# Patient Record
Sex: Male | Born: 1985
Health system: Southern US, Community
[De-identification: ages and names within clinical notes are randomized; demographics above are authoritative.]

## PROBLEM LIST (undated history)

## (undated) HISTORY — PX: WISDOM TOOTH EXTRACTION: SHX21

---

## 2015-04-17 ENCOUNTER — Encounter (HOSPITAL_BASED_OUTPATIENT_CLINIC_OR_DEPARTMENT_OTHER): Payer: Self-pay | Admitting: *Deleted

## 2015-04-17 ENCOUNTER — Emergency Department (HOSPITAL_BASED_OUTPATIENT_CLINIC_OR_DEPARTMENT_OTHER)
Admission: EM | Admit: 2015-04-17 | Discharge: 2015-04-17 | Disposition: A | Discharge status: Home / Self Care | Payer: Self-pay | Attending: Emergency Medicine | Admitting: Emergency Medicine

## 2015-04-17 DIAGNOSIS — K029 Dental caries, unspecified: Secondary | ICD-10-CM | POA: Insufficient documentation

## 2015-04-17 DIAGNOSIS — K088 Other specified disorders of teeth and supporting structures: Secondary | ICD-10-CM | POA: Insufficient documentation

## 2015-04-17 DIAGNOSIS — K002 Abnormalities of size and form of teeth: Secondary | ICD-10-CM | POA: Insufficient documentation

## 2015-04-17 DIAGNOSIS — K0889 Other specified disorders of teeth and supporting structures: Secondary | ICD-10-CM

## 2015-04-17 MED ORDER — PENICILLIN V POTASSIUM 250 MG PO TABS
500.0000 mg | ORAL_TABLET | Freq: Once | ORAL | Status: AC
  Administered 2015-04-17: 500 mg via ORAL
  Filled 2015-04-17: qty 2

## 2015-04-17 MED ORDER — PENICILLIN V POTASSIUM 500 MG PO TABS: 500.0000 mg | ORAL_TABLET | Freq: Four times a day (QID) | ORAL | Status: AC

## 2015-04-17 MED ORDER — OXYCODONE-ACETAMINOPHEN 5-325 MG PO TABS
1.0000 | ORAL_TABLET | Freq: Once | ORAL | Status: AC
  Administered 2015-04-17: 1 via ORAL
  Filled 2015-04-17: qty 1

## 2015-04-17 NOTE — Discharge Instructions (Signed)

## 2015-04-17 NOTE — ED Provider Notes (Signed)
CSN: [id number]     Arrival date & time 04/17/15  1933 History   First MD Initiated Contact with Patient 04/17/15 1943     Chief Complaint  Patient presents with  . Dental Pain     Patient is a 29 y.o. male presenting with tooth pain. The history is provided by the patient.  Dental Pain Location:  Lower Severity:  Moderate Onset quality:  Gradual Duration:  2 days Timing:  Constant Progression:  Worsening Chronicity:  New Relieved by:  Nothing Associated symptoms: no fever      PMH - none  History  Substance Use Topics  . Smoking status: Never Smoker   . Smokeless tobacco: Not on file  . Alcohol Use: No    Review of Systems  Constitutional: Negative for fever.  Gastrointestinal: Negative for vomiting.      Allergies  Review of patient's allergies indicates no known allergies.  Home Medications   Prior to Admission medications   Medication Sig Start Date End Date Taking? Authorizing Provider  penicillin v potassium (VEETID) 500 MG tablet Take 1 tablet (500 mg total) by mouth 4 (four) times daily. 04/17/15 04/24/15  Zadie Rhineonald Rhonna Holster, MD   BP 154/95 mmHg  Pulse 80  Temp(Src) 98.6 F (37 C)  Resp 18  Ht 6\' 2"  (1.88 m)  Wt 155 lb (70.308 kg)  BMI 19.89 kg/m2  SpO2 100% Physical Exam CONSTITUTIONAL: Well developed/well nourished HEAD AND FACE: Normocephalic/atraumatic EYES: EOMI/PERRL ENMT: Mucous membranes moist.  Poor dentition.  No trismus.  No focal abscess noted.  Decayed tooth to left lower premolarpromal NECK: supple no meningeal signs CV: S1/S2 noted, no murmurs/rubs/gallops noted LUNGS: Lungs are clear to auscultation bilaterally, no apparent distress ABDOMEN: soft, nontender, no rebound or guarding NEURO: Pt is awake/alert, moves all extremitiesx4 EXTREMITIES:full ROM SKIN: warm, color normal  ED Course  Procedures   He has f/u with dental later this week  MDM   Final diagnoses:  Pain, dental    Nursing notes including past medical  history and social history reviewed and considered in documentation     Zadie Rhineonald Monice Lundy, MD 04/17/15 1953

## 2015-04-17 NOTE — ED Notes (Signed)
Pt c/o dental pain x 2 days.  

## 2015-04-17 NOTE — ED Notes (Signed)
Pt significant other will drive pt home.

## 2016-10-29 ENCOUNTER — Emergency Department (HOSPITAL_BASED_OUTPATIENT_CLINIC_OR_DEPARTMENT_OTHER)
Admission: EM | Admit: 2016-10-29 | Discharge: 2016-10-29 | Disposition: A | Discharge status: Home / Self Care | Payer: Self-pay | Attending: Emergency Medicine | Admitting: Emergency Medicine

## 2016-10-29 ENCOUNTER — Encounter (HOSPITAL_BASED_OUTPATIENT_CLINIC_OR_DEPARTMENT_OTHER): Payer: Self-pay | Admitting: *Deleted

## 2016-10-29 DIAGNOSIS — K029 Dental caries, unspecified: Secondary | ICD-10-CM | POA: Insufficient documentation

## 2016-10-29 DIAGNOSIS — K0889 Other specified disorders of teeth and supporting structures: Secondary | ICD-10-CM

## 2016-10-29 MED ORDER — TRAMADOL HCL 50 MG PO TABS: 50.0000 mg | ORAL_TABLET | Freq: Four times a day (QID) | ORAL | 0 refills | Status: DC | PRN

## 2016-10-29 MED ORDER — TRAMADOL HCL 50 MG PO TABS
50.0000 mg | ORAL_TABLET | Freq: Once | ORAL | Status: AC
  Administered 2016-10-29: 50 mg via ORAL
  Filled 2016-10-29: qty 1

## 2016-10-29 MED ORDER — KETOROLAC TROMETHAMINE 60 MG/2ML IM SOLN
60.0000 mg | Freq: Once | INTRAMUSCULAR | Status: AC
  Administered 2016-10-29: 60 mg via INTRAMUSCULAR
  Filled 2016-10-29: qty 2

## 2016-10-29 NOTE — ED Triage Notes (Signed)
C/o left upper tooth pain x 2-3 days  Worse today

## 2016-10-29 NOTE — ED Provider Notes (Signed)
[facility]   CSN: [id number] Arrival date & time: 10/29/16  0149     History   Chief Complaint Chief Complaint  Patient presents with  . Dental Pain    HPI Benjamin Mullins is a 31 y.o. male with no significant past medical history presenting today with dental pain. He states this has been going on for the past couple of days intermittently. Tonight it got severe. Sick at home without any relief. He denies any swelling or drainage. He does not have a dentist as he cannot afford one. There are no further complaints.  10 Systems reviewed and are negative for acute change except as noted in the HPI.   HPI  History reviewed. No pertinent past medical history.  There are no active problems to display for this patient.   History reviewed. No pertinent surgical history.     Home Medications    Prior to Admission medications   Not on File    Family History No family history on file.  Social History Social History  Substance Use Topics  . Smoking status: Never Smoker  . Smokeless tobacco: Not on file  . Alcohol use No     Allergies   Patient has no known allergies.   Review of Systems Review of Systems   Physical Exam Updated Vital Signs BP 149/85 (BP Location: Right Arm)   Pulse 75   Temp 98.1 F (36.7 C) (Oral)   Resp 16   Ht 6\' 2"  (1.88 m)   Wt 176 lb (79.8 kg)   SpO2 100%   BMI 22.60 kg/m   Physical Exam  Constitutional: He is oriented to person, place, and time. Vital signs are normal. He appears well-developed and well-nourished.  Non-toxic appearance. He does not appear ill. No distress.  HENT:  Head: Normocephalic and atraumatic.  Nose: Nose normal.  Mouth/Throat: Oropharynx is clear and moist. No oropharyngeal exudate.  Overall dentition appears well. Tooth #13 however has a chipped. There is an obvious dental carie present as well.  Eyes: Conjunctivae and EOM are normal. Pupils are equal, round, and reactive to  light. No scleral icterus.  Neck: Normal range of motion. Neck supple. No tracheal deviation, no edema, no erythema and normal range of motion present. No thyroid mass and no thyromegaly present.  Cardiovascular: Normal rate, regular rhythm, S1 normal, S2 normal, normal heart sounds, intact distal pulses and normal pulses.  Exam reveals no gallop and no friction rub.   No murmur heard. Pulmonary/Chest: Effort normal and breath sounds normal. No respiratory distress. He has no wheezes. He has no rhonchi. He has no rales.  Abdominal: Soft. Normal appearance and bowel sounds are normal. He exhibits no distension, no ascites and no mass. There is no hepatosplenomegaly. There is no tenderness. There is no rebound, no guarding and no CVA tenderness.  Musculoskeletal: Normal range of motion. He exhibits no edema or tenderness.  Lymphadenopathy:    He has no cervical adenopathy.  Neurological: He is alert and oriented to person, place, and time. He has normal strength. No cranial nerve deficit or sensory deficit.  Skin: Skin is warm, dry and intact. No petechiae and no rash noted. He is not diaphoretic. No erythema. No pallor.  Nursing note and vitals reviewed.    ED Treatments / Results  Labs (all labs ordered are listed, but only abnormal results are displayed) Labs Reviewed - No data to display  EKG  EKG Interpretation None  Radiology No results found.  Procedures Procedures (including critical care time)  Medications Ordered in ED Medications  ketorolac (TORADOL) injection 60 mg (not administered)  traMADol (ULTRAM) tablet 50 mg (not administered)     Initial Impression / Assessment and Plan / ED Course  I have reviewed the triage vital signs and the nursing notes.  Pertinent labs & imaging results that were available during my care of the patient were reviewed by me and considered in my medical decision making (see chart for details).  Clinical Course     Patient  presents emergency department for dental pain. He was given Toradol and Tylenol for relief. We'll discharge home with 10 tablets of tramadol to take as needed. To follow-up also provided. He demonstrates good understanding of the plan that he see a dentist at this only get worse. He appears well-developed acute distress, vital signs were within his normal limits and he is safe for discharge.  Final Clinical Impressions(s) / ED Diagnoses   Final diagnoses:  Pain, dental    New Prescriptions New Prescriptions   No medications on file     Tomasita Crumble, MD 10/29/16 [phone]

## 2016-10-30 ENCOUNTER — Emergency Department (HOSPITAL_BASED_OUTPATIENT_CLINIC_OR_DEPARTMENT_OTHER)
Admission: EM | Admit: 2016-10-30 | Discharge: 2016-10-30 | Disposition: A | Discharge status: Home / Self Care | Payer: Self-pay | Attending: Emergency Medicine | Admitting: Emergency Medicine

## 2016-10-30 ENCOUNTER — Encounter (HOSPITAL_BASED_OUTPATIENT_CLINIC_OR_DEPARTMENT_OTHER): Payer: Self-pay | Admitting: *Deleted

## 2016-10-30 DIAGNOSIS — K047 Periapical abscess without sinus: Secondary | ICD-10-CM | POA: Insufficient documentation

## 2016-10-30 DIAGNOSIS — Z79899 Other long term (current) drug therapy: Secondary | ICD-10-CM | POA: Insufficient documentation

## 2016-10-30 DIAGNOSIS — K029 Dental caries, unspecified: Secondary | ICD-10-CM | POA: Insufficient documentation

## 2016-10-30 MED ORDER — PENICILLIN V POTASSIUM 500 MG PO TABS: 500.0000 mg | ORAL_TABLET | Freq: Three times a day (TID) | ORAL | 0 refills | Status: DC

## 2016-10-30 MED FILL — PENICILLIN VK 500 MG TABLET: 500 | 7 days supply | Qty: 21 | Fill #0

## 2016-10-30 NOTE — ED Provider Notes (Signed)
[facility]   CSN: [id number] Arrival date & time: 10/30/16  0849     History   Chief Complaint Chief Complaint  Patient presents with  . Dental Pain    HPI Benjamin Mullins is a 31 y.o. male.  Patient presents with 3-4 days of left maxillary facial pain, seen in emergency department on 10/29/16 and prescribed tramadol. Since then, patient has developed swelling in his left cheek. No difficulty with vision, breathing, swallowing. No fevers or vomiting. Patient has been taking over-the-counter NSAIDs without relief. Onset of symptoms acute. Course is gradually worsening. Nothing makes symptoms better or worse.      History reviewed. No pertinent past medical history.  There are no active problems to display for this patient.   Past Surgical History:  Procedure Laterality Date  . WISDOM TOOTH EXTRACTION         Home Medications    Prior to Admission medications   Medication Sig Start Date End Date Taking? Authorizing Provider  traMADol (ULTRAM) 50 MG tablet Take 1 tablet (50 mg total) by mouth every 6 (six) hours as needed. 10/29/16  Yes Tomasita Crumble, MD  penicillin v potassium (VEETID) 500 MG tablet Take 1 tablet (500 mg total) by mouth 3 (three) times daily. 10/30/16   Renne Crigler, PA-C    Family History No family history on file.  Social History Social History  Substance Use Topics  . Smoking status: Never Smoker  . Smokeless tobacco: Never Used  . Alcohol use Yes     Comment: beer occasionally     Allergies   Patient has no known allergies.   Review of Systems Review of Systems  Constitutional: Negative for fever.  HENT: Positive for dental problem and facial swelling. Negative for ear pain, sore throat and trouble swallowing.   Respiratory: Negative for shortness of breath and stridor.   Musculoskeletal: Negative for neck pain.  Skin: Negative for color change.  Neurological: Negative for headaches.     Physical  Exam Updated Vital Signs BP 137/98 (BP Location: Left Arm)   Pulse 94   Temp 98.1 F (36.7 C) (Oral)   Resp 18   Ht 6\' 2"  (1.88 m)   Wt 79.8 kg   SpO2 99%   BMI 22.60 kg/m   Physical Exam  Constitutional: He appears well-developed and well-nourished.  HENT:  Head: Normocephalic and atraumatic.  Right Ear: Tympanic membrane, external ear and ear canal normal.  Left Ear: Tympanic membrane, external ear and ear canal normal.  Nose: Nose normal.  Mouth/Throat: Uvula is midline, oropharynx is clear and moist and mucous membranes are normal. No trismus in the jaw. Abnormal dentition. Dental caries present. No dental abscesses or uvula swelling. No tonsillar abscesses.  Mild tenderness to palpation in the area of tooth #14. Patient has slight firmness of the soft tissues however no palpable abscess at this time. He has mild edema of the left upper cheek. No periorbital cellulitis.  Eyes: Pupils are equal, round, and reactive to light.  Neck: Normal range of motion. Neck supple.  No neck swelling or Lugwig's angina  Neurological: He is alert.  Skin: Skin is warm and dry.  Psychiatric: He has a normal mood and affect.  Nursing note and vitals reviewed.    ED Treatments / Results   Procedures Procedures (including critical care time)  Medications Ordered in ED Medications - No data to display   Initial Impression / Assessment and Plan / ED Course  I have  reviewed the triage vital signs and the nursing notes.  Pertinent labs & imaging results that were available during my care of the patient were reviewed by me and considered in my medical decision making (see chart for details).  Clinical Course    Patient seen and examined.  Rx penicillin.   Vital signs reviewed and are as follows: BP 137/98 (BP Location: Left Arm)   Pulse 94   Temp 98.1 F (36.7 C) (Oral)   Resp 18   Ht 6\' 2"  (1.88 m)   Wt 79.8 kg   SpO2 99%   BMI 22.60 kg/m   Patient counseled to take prescribed  medications as directed, return with worsening facial or neck swelling, and to follow-up with their dentist as soon as possible.   Final Clinical Impressions(s) / ED Diagnoses   Final diagnoses:  Dental infection   Patient with toothache. No fever. Exam unconcerning for Ludwig's angina or other deep tissue infection in neck.   As there is facial swelling, will treat with antibiotic and have patient continue NSAIDs and tramadol. Urged patient to follow-up with dentist.     New Prescriptions New Prescriptions   PENICILLIN V POTASSIUM (VEETID) 500 MG TABLET    Take 1 tablet (500 mg total) by mouth 3 (three) times daily.     Renne CriglerJoshua Caelin Rayl, PA-C 10/30/16 [id number]    Jacalyn LefevreJulie Haviland, MD 10/30/16 1025

## 2016-10-30 NOTE — ED Triage Notes (Signed)
Pain in left upper molar this week. Seen in ED and given tramadol. Now has facial swelling which started yesterday

## 2016-10-30 NOTE — Discharge Instructions (Signed)
Please read and follow all provided instructions.  Your diagnoses today include:  1. Dental infection     The exam and treatment you received today has been provided on an emergency basis only. This is not a substitute for complete medical or dental care.  Tests performed today include:  Vital signs. See below for your results today.   Medications prescribed:   Penicillin - antibiotic  You have been prescribed an antibiotic medicine: take the entire course of medicine even if you are feeling better. Stopping early can cause the antibiotic not to work.  Take any prescribed medications only as directed.  Home care instructions:  Follow any educational materials contained in this packet.  Follow-up instructions: Please follow-up with your dentist for further evaluation of your symptoms.   Return instructions:   Please return to the Emergency Department if you experience worsening symptoms.  Please return if you develop a fever, you develop more swelling in your face or neck, you have trouble breathing or swallowing food.  Please return if you have any other emergent concerns.  Additional Information:  Your vital signs today were: BP 137/98 (BP Location: Left Arm)    Pulse 94    Temp 98.1 F (36.7 C) (Oral)    Resp 18    Ht 6\' 2"  (1.88 m)    Wt 79.8 kg    SpO2 99%    BMI 22.60 kg/m  If your blood pressure (BP) was elevated above 135/85 this visit, please have this repeated by your doctor within one month. --------------

## 2017-02-11 ENCOUNTER — Encounter (HOSPITAL_COMMUNITY): Payer: Self-pay | Admitting: Emergency Medicine

## 2017-02-11 ENCOUNTER — Emergency Department (HOSPITAL_COMMUNITY)
Admission: EM | Admit: 2017-02-11 | Discharge: 2017-02-11 | Disposition: A | Discharge status: Home / Self Care | Payer: Self-pay | Attending: Emergency Medicine | Admitting: Emergency Medicine

## 2017-02-11 DIAGNOSIS — K029 Dental caries, unspecified: Secondary | ICD-10-CM | POA: Insufficient documentation

## 2017-02-11 DIAGNOSIS — Z79899 Other long term (current) drug therapy: Secondary | ICD-10-CM | POA: Insufficient documentation

## 2017-02-11 MED ORDER — TRAMADOL HCL 50 MG PO TABS
50.0000 mg | ORAL_TABLET | Freq: Four times a day (QID) | ORAL | 0 refills | Status: DC | PRN
Start: 2017-02-11 — End: 2020-07-28

## 2017-02-11 MED ORDER — PENICILLIN V POTASSIUM 500 MG PO TABS: 500.0000 mg | ORAL_TABLET | Freq: Three times a day (TID) | ORAL | 0 refills | Status: DC

## 2017-02-11 NOTE — ED Triage Notes (Signed)
Patient reports left lower molar pain unrelieved by OTC pain medication onset yesterday .

## 2017-02-11 NOTE — ED Provider Notes (Signed)
[facility] Provider Note   CSN: [id number] Arrival date & time: 02/11/17  1858     History   Chief Complaint Chief Complaint  Patient presents with  . Dental Pain    HPI Benjamin Mullins is a 31 y.o. male.  Patient is a 31 year old male who presents with dental pain. He states over last 2 days he's had worsening pain to his left lower tooth. He's had recurrent issues with this tooth. He doesn't have insurance and hasn't been able to follow-up with a dentist. His been taking over-the-counter medicines without relief. He denies any fevers or vomiting. He's noted a little bit of swelling around the area.      History reviewed. No pertinent past medical history.  There are no active problems to display for this patient.   Past Surgical History:  Procedure Laterality Date  . WISDOM TOOTH EXTRACTION         Home Medications    Prior to Admission medications   Medication Sig Start Date End Date Taking? Authorizing Provider  penicillin v potassium (VEETID) 500 MG tablet Take 1 tablet (500 mg total) by mouth 3 (three) times daily. 02/11/17   Rolan Bucco, MD  traMADol (ULTRAM) 50 MG tablet Take 1 tablet (50 mg total) by mouth every 6 (six) hours as needed. 02/11/17   Rolan Bucco, MD    Family History No family history on file.  Social History Social History  Substance Use Topics  . Smoking status: Never Smoker  . Smokeless tobacco: Never Used  . Alcohol use Yes     Comment: beer occasionally     Allergies   Patient has no known allergies.   Review of Systems Review of Systems  Constitutional: Negative for fever.  HENT: Positive for dental problem. Negative for facial swelling.   Gastrointestinal: Negative for nausea and vomiting.  Musculoskeletal: Negative for neck pain.  Skin: Negative for wound.  Neurological: Negative for headaches.     Physical Exam Updated Vital Signs BP (!) 137/95 (BP Location: Right Arm)   Pulse 82   Temp 98.3 F (36.8  C) (Oral)   Resp (!) 22   SpO2 99%   Physical Exam  Constitutional: He is oriented to person, place, and time. He appears well-developed and well-nourished.  HENT:  Head: Normocephalic and atraumatic.  Positive decayed tooth involving the left lower first molar. There is tenderness and mild fullness to the area. No palpable abscess. No trismus. No visible facial swelling.  Neck: Normal range of motion. Neck supple.  Cardiovascular: Normal rate.   Pulmonary/Chest: Effort normal.  Neurological: He is alert and oriented to person, place, and time.     ED Treatments / Results  Labs (all labs ordered are listed, but only abnormal results are displayed) Labs Reviewed - No data to display  EKG  EKG Interpretation None       Radiology No results found.  Procedures Procedures (including critical care time)  Medications Ordered in ED Medications - No data to display   Initial Impression / Assessment and Plan / ED Course  I have reviewed the triage vital signs and the nursing notes.  Pertinent labs & imaging results that were available during my care of the patient were reviewed by me and considered in my medical decision making (see chart for details).     Patient started on penicillin and given the small amount of tramadol to use in addition to his over-the-counter medications. He was given a Facilities manager for dental  follow-up. He was advised to follow-up with a dentist as soon as possible. Return precautions were given.  Final Clinical Impressions(s) / ED Diagnoses   Final diagnoses:  Dental caries    New Prescriptions Current Discharge Medication List       Rolan Bucco, MD 02/11/17 2038

## 2018-12-01 ENCOUNTER — Emergency Department (HOSPITAL_COMMUNITY)
Admission: EM | Admit: 2018-12-01 | Discharge: 2018-12-01 | Disposition: A | Discharge status: Home / Self Care | Payer: Self-pay | Attending: Emergency Medicine | Admitting: Emergency Medicine

## 2018-12-01 ENCOUNTER — Encounter (HOSPITAL_COMMUNITY): Payer: Self-pay | Admitting: Emergency Medicine

## 2018-12-01 ENCOUNTER — Other Ambulatory Visit: Payer: Self-pay

## 2018-12-01 ENCOUNTER — Emergency Department (HOSPITAL_COMMUNITY): Payer: Self-pay

## 2018-12-01 DIAGNOSIS — R05 Cough: Secondary | ICD-10-CM | POA: Insufficient documentation

## 2018-12-01 DIAGNOSIS — R059 Cough, unspecified: Secondary | ICD-10-CM

## 2018-12-01 MED ORDER — CETIRIZINE-PSEUDOEPHEDRINE ER 5-120 MG PO TB12: 1.0000 | ORAL_TABLET | Freq: Every day | ORAL | 0 refills | Status: DC

## 2018-12-01 MED ORDER — PREDNISONE 10 MG (21) PO TBPK: ORAL_TABLET | ORAL | 0 refills | Status: DC

## 2018-12-01 MED ORDER — BENZONATATE 100 MG PO CAPS: 100.0000 mg | ORAL_CAPSULE | Freq: Three times a day (TID) | ORAL | 0 refills | Status: DC | PRN

## 2018-12-01 NOTE — ED Triage Notes (Signed)
Pt. Stated, ive had a cough for 2 months worse last 2 days

## 2018-12-01 NOTE — Discharge Instructions (Addendum)
Take prednisone as directed until completed.  Take Zyrtec once daily.  Take Tessalon every 8 hours as needed for cough.  Please return the emergency department you develop any new or worsening symptoms.

## 2018-12-01 NOTE — ED Provider Notes (Signed)
[facility] EMERGENCY DEPARTMENT Provider Note   CSN: [id number] Arrival date & time: 12/01/18  1655     History   Chief Complaint Chief Complaint  Patient presents with  . Cough    HPI Benjamin Mullins is a 33 y.o. male who is previously healthy who presents with a 54-month history of dry cough.  He reports it started with cold-like symptoms which have resolved, however his cough persists and is worse at night.  He denies any fevers, chest pain, shortness of breath, abdominal pain, nausea, vomiting.  He has been taking NyQuil at night.  He denies any recent travel.  He also denies any recent surgeries, new leg pain or swelling from history of blood clots, known cancer.  He reports he very rarely smokes 5 miles when he drinks, however not regularly.  HPI  History reviewed. No pertinent past medical history.  There are no active problems to display for this patient.   Past Surgical History:  Procedure Laterality Date  . WISDOM TOOTH EXTRACTION          Home Medications    Prior to Admission medications   Medication Sig Start Date End Date Taking? Authorizing Provider  benzonatate (TESSALON) 100 MG capsule Take 1 capsule (100 mg total) by mouth 3 (three) times daily as needed for cough. 12/01/18   Astaria Nanez, Waylan Boga, PA-C  cetirizine-pseudoephedrine (ZYRTEC-D) 5-120 MG tablet Take 1 tablet by mouth daily. 12/01/18   Gabrelle Roca, Waylan Boga, PA-C  penicillin v potassium (VEETID) 500 MG tablet Take 1 tablet (500 mg total) by mouth 3 (three) times daily. 02/11/17   Rolan Bucco, MD  predniSONE (STERAPRED UNI-PAK 21 TAB) 10 MG (21) TBPK tablet Take 6 tabs by mouth daily for 2 days, 5 tabs for 2 days, 4 tabs for 2 days, 3 tabs for 2 days, 2 tabs for 2 days, then 1 tab for 2 days. 12/01/18   Onetta Spainhower, Waylan Boga, PA-C  traMADol (ULTRAM) 50 MG tablet Take 1 tablet (50 mg total) by mouth every 6 (six) hours as needed. 02/11/17   Rolan Bucco, MD    Family History No family history  on file.  Social History Social History   Tobacco Use  . Smoking status: Never Smoker  . Smokeless tobacco: Never Used  Substance Use Topics  . Alcohol use: Yes    Comment: beer occasionally  . Drug use: No     Allergies   Patient has no known allergies.   Review of Systems Review of Systems  Constitutional: Negative for chills and fever.  HENT: Negative for facial swelling and sore throat.   Respiratory: Positive for cough. Negative for shortness of breath.   Cardiovascular: Negative for chest pain.  Gastrointestinal: Negative for abdominal pain, nausea and vomiting.  Genitourinary: Negative for dysuria.  Musculoskeletal: Negative for back pain.  Skin: Negative for rash and wound.  Neurological: Negative for headaches.  Psychiatric/Behavioral: The patient is not nervous/anxious.      Physical Exam Updated Vital Signs BP 121/70 (BP Location: Right Arm)   Pulse 71   Temp 97.9 F (36.6 C) (Oral)   Resp 17   Ht 6\' 2"  (1.88 m)   Wt 88.5 kg   SpO2 98%   BMI 25.04 kg/m   Physical Exam Vitals signs and nursing note reviewed.  Constitutional:      General: He is not in acute distress.    Appearance: He is well-developed. He is not diaphoretic.  HENT:     Head:  Normocephalic and atraumatic.     Right Ear: Tympanic membrane normal.     Left Ear: Tympanic membrane normal.     Mouth/Throat:     Pharynx: No oropharyngeal exudate or posterior oropharyngeal erythema.  Eyes:     General: No scleral icterus.       Right eye: No discharge.        Left eye: No discharge.     Conjunctiva/sclera: Conjunctivae normal.     Pupils: Pupils are equal, round, and reactive to light.  Neck:     Musculoskeletal: Normal range of motion and neck supple.     Thyroid: No thyromegaly.  Cardiovascular:     Rate and Rhythm: Normal rate and regular rhythm.     Heart sounds: Normal heart sounds. No murmur. No friction rub. No gallop.   Pulmonary:     Effort: Pulmonary effort is  normal. No respiratory distress.     Breath sounds: Normal breath sounds. No stridor. No wheezing or rales.  Abdominal:     General: Bowel sounds are normal. There is no distension.     Palpations: Abdomen is soft.     Tenderness: There is no abdominal tenderness. There is no guarding or rebound.  Lymphadenopathy:     Cervical: No cervical adenopathy.  Skin:    General: Skin is warm and dry.     Coloration: Skin is not pale.     Findings: No rash.  Neurological:     Mental Status: He is alert.     Coordination: Coordination normal.      ED Treatments / Results  Labs (all labs ordered are listed, but only abnormal results are displayed) Labs Reviewed - No data to display  EKG None  Radiology Dg Chest 2 View  Result Date: 12/01/2018 CLINICAL DATA:  Persistent dry cough for 2 months worse over the past 2 days. EXAM: CHEST - 2 VIEW COMPARISON:  None. FINDINGS: The heart size and mediastinal contours are within normal limits. Both lungs are clear. The visualized skeletal structures are unremarkable. Irregular soft tissue calcifications are seen in the supraclavicular portion the thorax on right possibly related to old remote trauma. IMPRESSION: No active cardiopulmonary disease. Electronically Signed   By: Tollie Ethavid  Kwon M.D.   On: 12/01/2018 17:34    Procedures Procedures (including critical care time)  Medications Ordered in ED Medications - No data to display   Initial Impression / Assessment and Plan / ED Course  I have reviewed the triage vital signs and the nursing notes.  Pertinent labs & imaging results that were available during my care of the patient were reviewed by me and considered in my medical decision making (see chart for details).     Patient presenting with probable post viral bronchitis.  Chest x-ray is clear.  Patient is well-appearing.  No significant active coughing on my exam.  Lungs are clear to auscultation.  No wheezing auscultated.  Will discharge  home with prednisone taper, Zyrtec-D, and Tessalon.  Return precautions discussed.  Patient understands and agrees with plan.  Patient vital stable throughout ED course and discharged in satisfactory condition.  Final Clinical Impressions(s) / ED Diagnoses   Final diagnoses:  Cough    ED Discharge Orders         Ordered    benzonatate (TESSALON) 100 MG capsule  3 times daily PRN     12/01/18 2104    cetirizine-pseudoephedrine (ZYRTEC-D) 5-120 MG tablet  Daily     12/01/18 2104  predniSONE (STERAPRED UNI-PAK 21 TAB) 10 MG (21) TBPK tablet     12/01/18 2104           Emi HolesLaw, Vamsi Apfel M, PA-C 12/01/18 2106    Lorre NickAllen, Anthony, MD 12/02/18 1115

## 2019-01-01 ENCOUNTER — Encounter (HOSPITAL_BASED_OUTPATIENT_CLINIC_OR_DEPARTMENT_OTHER): Payer: Self-pay | Admitting: Emergency Medicine

## 2019-01-01 ENCOUNTER — Emergency Department (HOSPITAL_BASED_OUTPATIENT_CLINIC_OR_DEPARTMENT_OTHER)
Admission: EM | Admit: 2019-01-01 | Discharge: 2019-01-01 | Disposition: A | Discharge status: Home / Self Care | Payer: Self-pay | Attending: Emergency Medicine | Admitting: Emergency Medicine

## 2019-01-01 ENCOUNTER — Other Ambulatory Visit: Payer: Self-pay

## 2019-01-01 DIAGNOSIS — S99911A Unspecified injury of right ankle, initial encounter: Secondary | ICD-10-CM | POA: Insufficient documentation

## 2019-01-01 DIAGNOSIS — Z0489 Encounter for examination and observation for other specified reasons: Secondary | ICD-10-CM | POA: Insufficient documentation

## 2019-01-01 DIAGNOSIS — X501XXA Overexertion from prolonged static or awkward postures, initial encounter: Secondary | ICD-10-CM | POA: Insufficient documentation

## 2019-01-01 DIAGNOSIS — Y929 Unspecified place or not applicable: Secondary | ICD-10-CM | POA: Insufficient documentation

## 2019-01-01 DIAGNOSIS — Y999 Unspecified external cause status: Secondary | ICD-10-CM | POA: Insufficient documentation

## 2019-01-01 DIAGNOSIS — Y9367 Activity, basketball: Secondary | ICD-10-CM | POA: Insufficient documentation

## 2019-01-01 NOTE — ED Triage Notes (Addendum)
Reports ankle injury 2 days ago.  Denies pain at present.  Reports injury happened at work and he was sent from work to "have it checked out". Ambulatory to triage in NAD.

## 2019-01-01 NOTE — ED Provider Notes (Signed)
[facility] EMERGENCY DEPARTMENT Provider Note   CSN: [id number] Arrival date & time: 01/01/19  1454    History   Chief Complaint Chief Complaint  Patient presents with  . Ankle Injury    HPI Benjamin Mullins is a 33 y.o. male who presents to the emergency department for evaluation of ankle injury at the request of his employer. Patient states that 2 days prior he was playing basketball and rolled the R ankle. No fall or head injury associated. He states he was initially limping with discomfort but not these sxs have completely resolved. No alleviating/aggravating factors. Currently asymptomatic. Denies pain, numbness, tingling, or weakness. No other areas of injury.      HPI  History reviewed. No pertinent past medical history.  There are no active problems to display for this patient.   Past Surgical History:  Procedure Laterality Date  . WISDOM TOOTH EXTRACTION          Home Medications    Prior to Admission medications   Medication Sig Start Date End Date Taking? Authorizing Provider  benzonatate (TESSALON) 100 MG capsule Take 1 capsule (100 mg total) by mouth 3 (three) times daily as needed for cough. 12/01/18   Law, Waylan Boga, PA-C  cetirizine-pseudoephedrine (ZYRTEC-D) 5-120 MG tablet Take 1 tablet by mouth daily. 12/01/18   Law, Waylan Boga, PA-C  penicillin v potassium (VEETID) 500 MG tablet Take 1 tablet (500 mg total) by mouth 3 (three) times daily. 02/11/17   Rolan Bucco, MD  predniSONE (STERAPRED UNI-PAK 21 TAB) 10 MG (21) TBPK tablet Take 6 tabs by mouth daily for 2 days, 5 tabs for 2 days, 4 tabs for 2 days, 3 tabs for 2 days, 2 tabs for 2 days, then 1 tab for 2 days. 12/01/18   Law, Waylan Boga, PA-C  traMADol (ULTRAM) 50 MG tablet Take 1 tablet (50 mg total) by mouth every 6 (six) hours as needed. 02/11/17   Rolan Bucco, MD    Family History History reviewed. No pertinent family history.  Social History Social History   Tobacco Use  .  Smoking status: Never Smoker  . Smokeless tobacco: Never Used  Substance Use Topics  . Alcohol use: Yes    Comment: beer occasionally  . Drug use: No     Allergies   Patient has no known allergies.   Review of Systems Review of Systems  Constitutional: Negative for chills and fever.  Musculoskeletal: Positive for arthralgias (resolved at present).  Neurological: Negative for weakness and numbness.       Negative for paresthesias.      Physical Exam Updated Vital Signs BP 126/81 (BP Location: Left Arm)   Pulse 94   Temp 97.7 F (36.5 C) (Oral)   Resp 14   Ht 6\' 2"  (1.88 m)   Wt 87.1 kg   SpO2 100%   BMI 24.65 kg/m   Physical Exam Vitals signs and nursing note reviewed.  Constitutional:      General: He is not in acute distress.    Appearance: He is well-developed.  HENT:     Head: Normocephalic and atraumatic.  Eyes:     General:        Right eye: No discharge.        Left eye: No discharge.     Conjunctiva/sclera: Conjunctivae normal.  Cardiovascular:     Comments: 2+ DP/PT pulses bilaterally. Musculoskeletal:     Comments: Lower extremities: No obvious deformity, appreciable swelling, erythema, ecchymosis, warmth, or open  wounds.  Patient has intact active range of motion throughout including to the ankles bilaterally.  The lower extremities are completely nontender.  Specifically no tenderness to the medial/lateral malleoli, base the fifth, navicular bone, or the head of the fibula.  Neurovascularly intact distally.  Skin:    General: Skin is warm and dry.  Neurological:     Mental Status: He is alert.     Comments: Clear speech.  Sensation grossly intact bilateral lower extremities.  5 out of 5 symmetric strength with plantar and dorsiflexion bilaterally.  Patient is ambulatory.  Psychiatric:        Behavior: Behavior normal.        Thought Content: Thought content normal.    ED Treatments / Results  Labs (all labs ordered are listed, but only  abnormal results are displayed) Labs Reviewed - No data to display  EKG None  Radiology No results found.  Procedures Procedures (including critical care time)  Medications Ordered in ED Medications - No data to display   Initial Impression / Assessment and Plan / ED Course  I have reviewed the triage vital signs and the nursing notes.  Pertinent labs & imaging results that were available during my care of the patient were reviewed by me and considered in my medical decision making (see chart for details).   Patient presents to the emergency department at request of his employer for right ankle injury which occurred 2 days prior, currently patient is completely asymptomatic without complaints.  Exam without obvious deformity or open wounds. ROM intact.  Non tender to palpation.Marland Kitchen NVI distally.  Without complaints of pain and nontender exam without deficits do not feel that imaging is necessary, patient is in agreement.  We offered therapeutic splint which he declined as he states it is no longer bothersome. I discussed follow-up, and return precautions with the patient. Provided opportunity for questions, patient confirmed understanding and are in agreement with plan.   Final Clinical Impressions(s) / ED Diagnoses   Final diagnoses:  Injury of right ankle, initial encounter    ED Discharge Orders    None       Cherly Anderson, PA-C 01/01/19 1544    Rolan Bucco, MD 01/01/19 2240

## 2019-01-01 NOTE — Discharge Instructions (Addendum)
Follow-up with primary care within 1 week.  Return to the ER for new or worsening symptoms or any other concerns.

## 2019-02-14 ENCOUNTER — Emergency Department (HOSPITAL_BASED_OUTPATIENT_CLINIC_OR_DEPARTMENT_OTHER)
Admission: EM | Admit: 2019-02-14 | Discharge: 2019-02-14 | Disposition: A | Discharge status: Home / Self Care | Payer: Self-pay | Attending: Emergency Medicine | Admitting: Emergency Medicine

## 2019-02-14 ENCOUNTER — Encounter (HOSPITAL_BASED_OUTPATIENT_CLINIC_OR_DEPARTMENT_OTHER): Payer: Self-pay | Admitting: *Deleted

## 2019-02-14 ENCOUNTER — Other Ambulatory Visit: Payer: Self-pay

## 2019-02-14 ENCOUNTER — Emergency Department (HOSPITAL_BASED_OUTPATIENT_CLINIC_OR_DEPARTMENT_OTHER): Payer: Self-pay

## 2019-02-14 DIAGNOSIS — M25531 Pain in right wrist: Secondary | ICD-10-CM | POA: Insufficient documentation

## 2019-02-14 MED ORDER — MELOXICAM 15 MG PO TABS: 15.0000 mg | ORAL_TABLET | Freq: Every day | ORAL | 0 refills | Status: DC

## 2019-02-14 NOTE — Discharge Instructions (Signed)
Please read and follow all provided instructions.  Your diagnoses today include:  1. Right wrist pain     Tests performed today include:  An x-ray of your wrist - does NOT show any broken bones  Vital signs. See below for your results today.   Medications prescribed:   Meloxicam - anti-inflammatory pain medication  You have been prescribed an anti-inflammatory medication or NSAID. Take with food. Do not take aspirin, ibuprofen, or naproxen if taking this medication. Take smallest effective dose for the shortest duration needed for your pain. Stop taking if you experience stomach pain or vomiting.   Take any prescribed medications only as directed.  Home care instructions:   Follow any educational materials contained in this packet  Wear your splint for at least one week or until seen by a physician for a follow-up examination.  Follow R.I.C.E. Protocol:  R - rest your injury   I  - use ice on injury without applying directly to skin  C - compress injury with bandage or splint  E - elevate the injury above the level of your heart as much as possible to reduce pain and swelling  Follow-up instructions: Please follow-up with your primary care provider or the provided orthopedic (bone specialist) if you continue to have significant pain or trouble using your wrist in 1 week. In this case you may have a severe injury that requires further care.   Return instructions:   Please return if your fingers are numb or tingling, appear very red, white, gray or blue, or you have severe pain (also elevate wrist and loosen splint or wrap)  Please return if you have difficulty moving your fingers.  Please return to the Emergency Department if you experience worsening symptoms.   Please return if you have any other emergent concerns.  Additional Information:  Your vital signs today were: BP (!) 153/105 (BP Location: Left Arm)    Pulse 83    Temp 98.2 F (36.8 C) (Oral)    Resp 16     Ht 6\' 2"  (1.88 m)    Wt 87.5 kg    SpO2 99%    BMI 24.78 kg/m  If your blood pressure (BP) was elevated above 135/85 this visit, please have this repeated by your doctor within one month. -------------- Wrist injuries are frequent in adults and children. A sprain is an injury to the ligaments that hold your bones together. A strain is an injury to muscle or muscle tendons (cord like structure) from stretching or pulling.   Remember the importance of follow-up and possible follow-up x-rays. Improvement in pain level is not 100% insurance of not having a fracture. --------------

## 2019-02-14 NOTE — ED Triage Notes (Signed)
Pt reports ongoing wrist pain in right wrist since Nov. Pt states he has a cyst on same wrist. He works for [employer] and has repetitive movements while working. Denies injury

## 2019-02-14 NOTE — ED Provider Notes (Signed)
[facility] EMERGENCY DEPARTMENT Provider Note   CSN: [id number] Arrival date & time: 02/14/19  1614    History   Chief Complaint Chief Complaint  Patient presents with  . Wrist Pain    HPI Benjamin Mullins is a 33 y.o. male.     Patient presents to the emergency department with complaint of pain in his right wrist and distal forearm ongoing over the past 5 months.  Patient began working for [employer] in November.  He states that he has some pain while he is working but after he is done, he has significant pain that occurs in his distal forearm area that is worse with movement.  He has had what sounds like a ganglion cyst in his wrist occurring over the years, however he does not feel any fluid collections currently.  He has been taking Goody powder.  States that his pain improves over the weekends but returns when he is working during the week.  No numbness or tingling.  No weakness reported.  No acute injuries.     History reviewed. No pertinent past medical history.  There are no active problems to display for this patient.   Past Surgical History:  Procedure Laterality Date  . WISDOM TOOTH EXTRACTION          Home Medications    Prior to Admission medications   Medication Sig Start Date End Date Taking? Authorizing Provider  benzonatate (TESSALON) 100 MG capsule Take 1 capsule (100 mg total) by mouth 3 (three) times daily as needed for cough. 12/01/18   Law, Waylan BogaAlexandra M, PA-C  cetirizine-pseudoephedrine (ZYRTEC-D) 5-120 MG tablet Take 1 tablet by mouth daily. 12/01/18   Law, Waylan BogaAlexandra M, PA-C  penicillin v potassium (VEETID) 500 MG tablet Take 1 tablet (500 mg total) by mouth 3 (three) times daily. 02/11/17   Rolan BuccoBelfi, Melanie, MD  predniSONE (STERAPRED UNI-PAK 21 TAB) 10 MG (21) TBPK tablet Take 6 tabs by mouth daily for 2 days, 5 tabs for 2 days, 4 tabs for 2 days, 3 tabs for 2 days, 2 tabs for 2 days, then 1 tab for 2 days. 12/01/18   Law, Waylan BogaAlexandra M, PA-C  traMADol  (ULTRAM) 50 MG tablet Take 1 tablet (50 mg total) by mouth every 6 (six) hours as needed. 02/11/17   Rolan BuccoBelfi, Melanie, MD    Family History No family history on file.  Social History Social History   Tobacco Use  . Smoking status: Never Smoker  . Smokeless tobacco: Never Used  Substance Use Topics  . Alcohol use: Yes    Comment: beer occasionally  . Drug use: No     Allergies   Patient has no known allergies.   Review of Systems Review of Systems  Constitutional: Negative for activity change.  Musculoskeletal: Positive for arthralgias and myalgias. Negative for back pain, gait problem, joint swelling and neck pain.  Skin: Negative for wound.  Neurological: Negative for weakness and numbness.     Physical Exam Updated Vital Signs BP (!) 153/105 (BP Location: Left Arm)   Pulse 83   Temp 98.2 F (36.8 C) (Oral)   Resp 16   Ht 6\' 2"  (1.88 m)   Wt 87.5 kg   SpO2 99%   BMI 24.78 kg/m   Physical Exam Vitals signs and nursing note reviewed.  Constitutional:      Appearance: He is well-developed.  HENT:     Head: Normocephalic and atraumatic.  Eyes:     Conjunctiva/sclera: Conjunctivae normal.  Neck:  Musculoskeletal: Normal range of motion and neck supple.  Cardiovascular:     Pulses: Normal pulses. No decreased pulses.          Radial pulses are 2+ on the right side and 2+ on the left side.  Musculoskeletal:        General: Tenderness present.     Right shoulder: Normal.     Right elbow: Normal.    Right wrist: He exhibits tenderness. He exhibits normal range of motion, no bony tenderness and no swelling.     Right forearm: He exhibits tenderness and swelling. He exhibits no bony tenderness.       Arms:     Right hand: Normal.  Skin:    General: Skin is warm and dry.  Neurological:     Mental Status: He is alert.     Sensory: No sensory deficit.     Comments: Motor, sensation, and vascular distal to the injury is fully intact.       ED Treatments  / Results  Labs (all labs ordered are listed, but only abnormal results are displayed) Labs Reviewed - No data to display  EKG None  Radiology No results found.  Procedures Procedures (including critical care time)  Medications Ordered in ED Medications - No data to display   Initial Impression / Assessment and Plan / ED Course  I have reviewed the triage vital signs and the nursing notes.  Pertinent labs & imaging results that were available during my care of the patient were reviewed by me and considered in my medical decision making (see chart for details).        Patient seen and examined.  Will obtain x-ray to ensure no bony injury.  Patient will likely be given a wrist splint to use for comfort, prescription for anti-inflammatories, and orthopedic follow-up.  Discussed rice protocol.  Vital signs reviewed and are as follows: BP (!) 153/105 (BP Location: Left Arm)   Pulse 83   Temp 98.2 F (36.8 C) (Oral)   Resp 16   Ht 6\' 2"  (1.88 m)   Wt 87.5 kg   SpO2 99%   BMI 24.78 kg/m   6:20 PM X-ray images personally reviewed and interpreted.    Plan as above. Encouraged ortho follow-up.    Final Clinical Impressions(s) / ED Diagnoses   Final diagnoses:  Right wrist pain   Patient with right wrist pain ongoing over the past 5 months.  Imaging without any signs of fracture.  Suspect overuse injury.  We will continue conservative measures and outpatient follow-up with orthopedist for further evaluation.  Hand and wrist are neurovascularly intact.  ED Discharge Orders    None       Renne Crigler, Cordelia Poche 02/14/19 1821    Tegeler, Canary Brim, MD 02/14/19 1910

## 2019-04-19 ENCOUNTER — Other Ambulatory Visit: Payer: Self-pay

## 2019-04-19 ENCOUNTER — Encounter (HOSPITAL_BASED_OUTPATIENT_CLINIC_OR_DEPARTMENT_OTHER): Payer: Self-pay | Admitting: Emergency Medicine

## 2019-04-19 ENCOUNTER — Emergency Department (HOSPITAL_BASED_OUTPATIENT_CLINIC_OR_DEPARTMENT_OTHER)
Admission: EM | Admit: 2019-04-19 | Discharge: 2019-04-19 | Disposition: A | Discharge status: Home / Self Care | Payer: Self-pay | Attending: Emergency Medicine | Admitting: Emergency Medicine

## 2019-04-19 DIAGNOSIS — K029 Dental caries, unspecified: Secondary | ICD-10-CM | POA: Insufficient documentation

## 2019-04-19 DIAGNOSIS — Z79899 Other long term (current) drug therapy: Secondary | ICD-10-CM | POA: Insufficient documentation

## 2019-04-19 MED ORDER — IBUPROFEN 600 MG PO TABS: 600.0000 mg | ORAL_TABLET | Freq: Four times a day (QID) | ORAL | 0 refills | Status: DC | PRN

## 2019-04-19 MED ORDER — HYDROCODONE-ACETAMINOPHEN 5-325 MG PO TABS: 1.0000 | ORAL_TABLET | ORAL | 0 refills | Status: DC | PRN

## 2019-04-19 MED ORDER — PENICILLIN V POTASSIUM 500 MG PO TABS: 500.0000 mg | ORAL_TABLET | Freq: Three times a day (TID) | ORAL | 0 refills | Status: DC

## 2019-04-19 MED FILL — IBUPROFEN 600 MG TABLET: 600 | 7 days supply | Qty: 30 | Fill #0

## 2019-04-19 MED FILL — HYDROCODON-APAP 5-325: 5-325 | 2 days supply | Qty: 10 | Fill #0

## 2019-04-19 MED FILL — PENICILLIN VK 500 MG TABLET: 500 | 7 days supply | Qty: 21 | Fill #0

## 2019-04-19 NOTE — ED Triage Notes (Signed)
Pt presents with left lower dental pain for 2 days. Pt used advil and bc powders at home without relief.

## 2019-04-19 NOTE — ED Provider Notes (Signed)
[place] EMERGENCY DEPARTMENT Provider Note   CSN: [id number] Arrival date & time: 04/19/19  0820     History   Chief Complaint Chief Complaint  Patient presents with  . Dental Pain    HPI Benjamin Mullins is a 33 y.o. male.     Pt presents to the ED today with dental pain.  The pt has had problems with this particular tooth in the past, but not in a few years.  He does not have a dentist.  His tooth has been hurting for 2 days.  He's been unable to sleep.     History reviewed. No pertinent past medical history.  There are no active problems to display for this patient.   Past Surgical History:  Procedure Laterality Date  . WISDOM TOOTH EXTRACTION          Home Medications    Prior to Admission medications   Medication Sig Start Date End Date Taking? Authorizing Provider  benzonatate (TESSALON) 100 MG capsule Take 1 capsule (100 mg total) by mouth 3 (three) times daily as needed for cough. 12/01/18   Law, Bea Graff, PA-C  cetirizine-pseudoephedrine (ZYRTEC-D) 5-120 MG tablet Take 1 tablet by mouth daily. 12/01/18   Law, Bea Graff, PA-C  HYDROcodone-acetaminophen (NORCO/VICODIN) 5-325 MG tablet Take 1 tablet by mouth every 4 (four) hours as needed. 04/19/19   Isla Pence, MD  ibuprofen (ADVIL) 600 MG tablet Take 1 tablet (600 mg total) by mouth every 6 (six) hours as needed. 04/19/19   Isla Pence, MD  meloxicam (MOBIC) 15 MG tablet Take 1 tablet (15 mg total) by mouth daily. 02/14/19   Carlisle Cater, PA-C  penicillin v potassium (VEETID) 500 MG tablet Take 1 tablet (500 mg total) by mouth 3 (three) times daily. 04/19/19   Isla Pence, MD  predniSONE (STERAPRED UNI-PAK 21 TAB) 10 MG (21) TBPK tablet Take 6 tabs by mouth daily for 2 days, 5 tabs for 2 days, 4 tabs for 2 days, 3 tabs for 2 days, 2 tabs for 2 days, then 1 tab for 2 days. 12/01/18   Law, Bea Graff, PA-C  traMADol (ULTRAM) 50 MG tablet Take 1 tablet (50 mg total) by mouth every 6 (six)  hours as needed. 02/11/17   Malvin Johns, MD    Family History No family history on file.  Social History Social History   Tobacco Use  . Smoking status: Never Smoker  . Smokeless tobacco: Never Used  Substance Use Topics  . Alcohol use: Yes    Comment: beer occasionally  . Drug use: No     Allergies   Patient has no known allergies.   Review of Systems Review of Systems  HENT: Positive for dental problem.   All other systems reviewed and are negative.    Physical Exam Updated Vital Signs BP 127/80 (BP Location: Right Arm)   Pulse 81   Temp 98.4 F (36.9 C) (Oral)   Resp 18   Ht 6\' 2"  (1.88 m)   Wt 86.2 kg   SpO2 98%   BMI 24.39 kg/m   Physical Exam Vitals signs and nursing note reviewed.  Constitutional:      Appearance: Normal appearance.  HENT:     Head: Normocephalic and atraumatic.     Comments: Dental caries left lower molars    Right Ear: External ear normal.     Left Ear: External ear normal.     Nose: Nose normal.     Mouth/Throat:  Mouth: Mucous membranes are moist.  Eyes:     Extraocular Movements: Extraocular movements intact.     Conjunctiva/sclera: Conjunctivae normal.     Pupils: Pupils are equal, round, and reactive to light.  Neck:     Musculoskeletal: Normal range of motion and neck supple.  Cardiovascular:     Rate and Rhythm: Normal rate and regular rhythm.     Pulses: Normal pulses.     Heart sounds: Normal heart sounds.  Pulmonary:     Effort: Pulmonary effort is normal.     Breath sounds: Normal breath sounds.  Abdominal:     General: Abdomen is flat. Bowel sounds are normal.     Palpations: Abdomen is soft.  Musculoskeletal: Normal range of motion.  Skin:    General: Skin is warm and dry.     Capillary Refill: Capillary refill takes less than 2 seconds.  Neurological:     General: No focal deficit present.     Mental Status: He is alert and oriented to person, place, and time.  Psychiatric:        Mood and  Affect: Mood normal.        Behavior: Behavior normal.      ED Treatments / Results  Labs (all labs ordered are listed, but only abnormal results are displayed) Labs Reviewed - No data to display  EKG    Radiology No results found.  Procedures Procedures (including critical care time)  Medications Ordered in ED Medications - No data to display   Initial Impression / Assessment and Plan / ED Course  I have reviewed the triage vital signs and the nursing notes.  Pertinent labs & imaging results that were available during my care of the patient were reviewed by me and considered in my medical decision making (see chart for details).        Pt encouraged to f/u with the dentist.  Return if worse.  Final Clinical Impressions(s) / ED Diagnoses   Final diagnoses:  Dental caries    ED Discharge Orders         Ordered    penicillin v potassium (VEETID) 500 MG tablet  3 times daily     04/19/19 0853    HYDROcodone-acetaminophen (NORCO/VICODIN) 5-325 MG tablet  Every 4 hours PRN     04/19/19 0853    ibuprofen (ADVIL) 600 MG tablet  Every 6 hours PRN     04/19/19 0853           Jacalyn LefevreHaviland, Daqwan Dougal, MD 04/19/19 [phone]

## 2020-01-27 ENCOUNTER — Other Ambulatory Visit: Payer: Self-pay

## 2020-01-27 ENCOUNTER — Encounter (HOSPITAL_COMMUNITY): Payer: Self-pay

## 2020-01-27 ENCOUNTER — Emergency Department (HOSPITAL_COMMUNITY)
Admission: EM | Admit: 2020-01-27 | Discharge: 2020-01-28 | Disposition: A | Discharge status: Home / Self Care | Payer: BC Managed Care – PPO | Attending: Emergency Medicine | Admitting: Emergency Medicine

## 2020-01-27 DIAGNOSIS — K0889 Other specified disorders of teeth and supporting structures: Secondary | ICD-10-CM | POA: Insufficient documentation

## 2020-01-27 DIAGNOSIS — Z79899 Other long term (current) drug therapy: Secondary | ICD-10-CM | POA: Insufficient documentation

## 2020-01-27 NOTE — ED Triage Notes (Signed)
Pt arrives to ED w/ c/o dental pain x 2 week. Pt reports 10/10 pain.

## 2020-01-28 MED ORDER — LIDOCAINE VISCOUS HCL 2 % MT SOLN: 15.0000 mL | OROMUCOSAL | 0 refills | Status: DC | PRN

## 2020-01-28 MED ORDER — PENICILLIN V POTASSIUM 500 MG PO TABS: 500.0000 mg | ORAL_TABLET | Freq: Four times a day (QID) | ORAL | 0 refills | Status: AC

## 2020-01-28 MED ORDER — ACETAMINOPHEN 325 MG PO TABS
650.0000 mg | ORAL_TABLET | Freq: Once | ORAL | Status: AC
  Administered 2020-01-28: 650 mg via ORAL
  Filled 2020-01-28: qty 2

## 2020-01-28 NOTE — ED Provider Notes (Signed)
[facility] EMERGENCY DEPARTMENT Provider Note   CSN: [id number] Arrival date & time: 01/27/20  2234     History Chief Complaint  Patient presents with  . Dental Pain    Benjamin Mullins is a 34 y.o. male with no significant past medical history who presents to the emergency department with a chief complaint of dental pain.  He endorses left upper dental pain that began 2 weeks ago, but has significantly worsened over the last few days.  Pain is constant.  Pain is improved with eating cold food and drink.  He took a dose of ibuprofen just prior to arrival with no improvement in his symptoms.  He reports that he has been calling around trying to find a dentist, but is unable to get an appointment for 2 months.  He does not recall any recent injuries to his teeth.  No fever, chills, facial swelling, trismus, drooling, redness, swelling, or purulent drainage.  The history is provided by the patient. No language interpreter was used.       History reviewed. No pertinent past medical history.  There are no problems to display for this patient.   Past Surgical History:  Procedure Laterality Date  . WISDOM TOOTH EXTRACTION         History reviewed. No pertinent family history.  Social History   Tobacco Use  . Smoking status: Never Smoker  . Smokeless tobacco: Never Used  Substance Use Topics  . Alcohol use: Yes    Comment: beer occasionally  . Drug use: No    Home Medications Prior to Admission medications   Medication Sig Start Date End Date Taking? Authorizing Provider  benzonatate (TESSALON) 100 MG capsule Take 1 capsule (100 mg total) by mouth 3 (three) times daily as needed for cough. 12/01/18   Law, Waylan Boga, PA-C  cetirizine-pseudoephedrine (ZYRTEC-D) 5-120 MG tablet Take 1 tablet by mouth daily. 12/01/18   Law, Waylan Boga, PA-C  HYDROcodone-acetaminophen (NORCO/VICODIN) 5-325 MG tablet Take 1 tablet by mouth every 4 (four) hours as needed.  04/19/19   Jacalyn Lefevre, MD  ibuprofen (ADVIL) 600 MG tablet Take 1 tablet (600 mg total) by mouth every 6 (six) hours as needed. 04/19/19   Jacalyn Lefevre, MD  lidocaine (XYLOCAINE) 2 % solution Use as directed 15 mLs in the mouth or throat every 3 (three) hours as needed for mouth pain. 01/28/20   Jago Carton A, PA-C  meloxicam (MOBIC) 15 MG tablet Take 1 tablet (15 mg total) by mouth daily. 02/14/19   Renne Crigler, PA-C  penicillin v potassium (VEETID) 500 MG tablet Take 1 tablet (500 mg total) by mouth every 6 (six) hours for 5 days. 01/28/20 02/02/20  Timmie Dugue A, PA-C  predniSONE (STERAPRED UNI-PAK 21 TAB) 10 MG (21) TBPK tablet Take 6 tabs by mouth daily for 2 days, 5 tabs for 2 days, 4 tabs for 2 days, 3 tabs for 2 days, 2 tabs for 2 days, then 1 tab for 2 days. 12/01/18   Law, Waylan Boga, PA-C  traMADol (ULTRAM) 50 MG tablet Take 1 tablet (50 mg total) by mouth every 6 (six) hours as needed. 02/11/17   Rolan Bucco, MD    Allergies    Patient has no known allergies.  Review of Systems   Review of Systems  Constitutional: Negative for appetite change, chills and fever.  HENT: Positive for dental problem. Negative for facial swelling.   Respiratory: Negative for shortness of breath.   Cardiovascular: Negative for chest  pain.  Gastrointestinal: Negative for abdominal pain.  Genitourinary: Negative for dysuria.  Musculoskeletal: Negative for back pain.  Skin: Negative for rash.  Allergic/Immunologic: Negative for immunocompromised state.  Neurological: Negative for headaches.  Psychiatric/Behavioral: Negative for confusion.    Physical Exam Updated Vital Signs BP (!) 145/80 (BP Location: Right Arm)   Pulse 79   Temp 98.5 F (36.9 C) (Oral)   Resp 16   SpO2 100%   Physical Exam Vitals and nursing note reviewed.  Constitutional:      Appearance: He is well-developed.  HENT:     Head: Normocephalic.     Mouth/Throat:     Dentition: Abnormal dentition. Dental  tenderness present. No dental caries or dental abscesses.     Pharynx: Oropharynx is clear. Uvula midline. No pharyngeal swelling, oropharyngeal exudate, posterior oropharyngeal erythema or uvula swelling.      Comments: Distal side of the left maxillary first molar is fractured.  The tooth is tender to palpation.  No obvious dentin exposure.  No dental abscess.  No sublingual induration or swelling.  Eyes:     Conjunctiva/sclera: Conjunctivae normal.  Cardiovascular:     Rate and Rhythm: Normal rate and regular rhythm.     Heart sounds: No murmur.  Pulmonary:     Effort: Pulmonary effort is normal. No respiratory distress.     Breath sounds: No stridor. No wheezing, rhonchi or rales.  Chest:     Chest wall: No tenderness.  Abdominal:     General: There is no distension.     Palpations: Abdomen is soft.  Musculoskeletal:     Cervical back: Neck supple.  Skin:    General: Skin is warm and dry.  Neurological:     Mental Status: He is alert.  Psychiatric:        Behavior: Behavior normal.     ED Results / Procedures / Treatments   Labs (all labs ordered are listed, but only abnormal results are displayed) Labs Reviewed - No data to display  EKG None  Radiology No results found.  Procedures Procedures (including critical care time)  Medications Ordered in ED Medications  acetaminophen (TYLENOL) tablet 650 mg (650 mg Oral Given 01/28/20 0144)    ED Course  I have reviewed the triage vital signs and the nursing notes.  Pertinent labs & imaging results that were available during my care of the patient were reviewed by me and considered in my medical decision making (see chart for details).    MDM Rules/Calculators/A&P                      Patient with toothache.  No gross abscess.  Exam unconcerning for Ludwig's angina or spread of infection.  Will treat with penicillin and analgesia.  Urged patient to follow-up with dentist. Dental resources provided.  He is  hemodynamically stable and in no acute distress. Safe for discharge to home with outpatient follow up.    Final Clinical Impression(s) / ED Diagnoses Final diagnoses:  Dentalgia    Rx / DC Orders ED Discharge Orders         Ordered    lidocaine (XYLOCAINE) 2 % solution  Every  3 hours PRN     01/28/20 0136    penicillin v potassium (VEETID) 500 MG tablet  Every 6 hours     01/28/20 0136           Mackenize Delgadillo A, PA-C 01/28/20 0827    Orpah Greek, MD  01/29/20 0615  

## 2020-01-28 NOTE — Discharge Instructions (Addendum)
Thank you for allowing me to care for you today in the Emergency Department.   Take 650 mg of Tylenol or 600 mg of ibuprofen with food every 6 hours for pain.  You can alternate between these 2 medications every 3 hours if your pain returns.  For instance, you can take Tylenol at noon, followed by a dose of ibuprofen at 3, followed by second dose of Tylenol and 6.  You can swallow 15 mL of viscous lidocaine every 3 hours as needed for pain.  You can apply ice packs to areas that are sore.  Avoid applying heat to your face as this can make pain and swelling worse.  Dr. Mayford Knife is the on-call dentist for the ER.  Call his office tomorrow to see if he can see him in clinic.  I have also attached the dental resource list.  [facility] also has office hours tomorrow and may be able to see you.  Your information is below. [address] [place], [place] [address] Open on Friday from 8 Am to 1 PM They open for appointments at 7 AM [phone] You can also book appointments on their website.   Return to the emergency department if you develop significant facial or neck swelling, if you start to have thick, mucus-like drainage from the area, if you develop shortness of breath, drooling, edema, unable to swallow, have high fevers or chills, or other new, concerning symptoms.

## 2020-07-28 ENCOUNTER — Emergency Department (HOSPITAL_BASED_OUTPATIENT_CLINIC_OR_DEPARTMENT_OTHER)
Admission: EM | Admit: 2020-07-28 | Discharge: 2020-07-28 | Disposition: A | Discharge status: Home / Self Care | Payer: BC Managed Care – PPO | Attending: Emergency Medicine | Admitting: Emergency Medicine

## 2020-07-28 ENCOUNTER — Other Ambulatory Visit: Payer: Self-pay

## 2020-07-28 ENCOUNTER — Emergency Department (HOSPITAL_BASED_OUTPATIENT_CLINIC_OR_DEPARTMENT_OTHER): Payer: BC Managed Care – PPO

## 2020-07-28 ENCOUNTER — Encounter (HOSPITAL_BASED_OUTPATIENT_CLINIC_OR_DEPARTMENT_OTHER): Payer: Self-pay | Admitting: *Deleted

## 2020-07-28 DIAGNOSIS — M62830 Muscle spasm of back: Secondary | ICD-10-CM | POA: Diagnosis not present

## 2020-07-28 DIAGNOSIS — R062 Wheezing: Secondary | ICD-10-CM | POA: Insufficient documentation

## 2020-07-28 DIAGNOSIS — R059 Cough, unspecified: Secondary | ICD-10-CM

## 2020-07-28 DIAGNOSIS — Z20822 Contact with and (suspected) exposure to covid-19: Secondary | ICD-10-CM | POA: Diagnosis not present

## 2020-07-28 DIAGNOSIS — Z87891 Personal history of nicotine dependence: Secondary | ICD-10-CM | POA: Insufficient documentation

## 2020-07-28 DIAGNOSIS — R0602 Shortness of breath: Secondary | ICD-10-CM | POA: Insufficient documentation

## 2020-07-28 LAB — RESPIRATORY PANEL BY RT PCR (FLU A&B, COVID)
Influenza A by PCR: NEGATIVE
Influenza B by PCR: NEGATIVE
SARS Coronavirus 2 by RT PCR: NEGATIVE

## 2020-07-28 MED ORDER — ALBUTEROL SULFATE HFA 108 (90 BASE) MCG/ACT IN AERS
2.0000 | INHALATION_SPRAY | Freq: Once | RESPIRATORY_TRACT | Status: AC
  Administered 2020-07-28: 2 via RESPIRATORY_TRACT
  Filled 2020-07-28: qty 6.7

## 2020-07-28 MED ORDER — CYCLOBENZAPRINE HCL 10 MG PO TABS: 10.0000 mg | ORAL_TABLET | Freq: Two times a day (BID) | ORAL | 0 refills | Status: AC | PRN

## 2020-07-28 MED ORDER — PREDNISONE 50 MG PO TABS: 50.0000 mg | ORAL_TABLET | Freq: Every day | ORAL | 0 refills | Status: AC

## 2020-07-28 NOTE — ED Provider Notes (Signed)
[facility] EMERGENCY DEPARTMENT Provider Note   CSN: [id number] Arrival date & time: 07/28/20  3254     History Chief Complaint  Patient presents with  . Cough    Benjamin Mullins is a 34 y.o. male.  The history is provided by the patient and medical records. No language interpreter was used.  Cough Cough characteristics:  Productive Sputum characteristics:  Nondescript Severity:  Moderate Onset quality:  Gradual Duration:  1 week Timing:  Intermittent Progression:  Waxing and waning Chronicity:  Chronic Smoker: yes (at times)   Context: exposure to allergens   Relieved by:  Nothing Worsened by:  Smoking and environmental changes Ineffective treatments:  None tried Associated symptoms: shortness of breath and wheezing   Associated symptoms: no chest pain, no chills, no diaphoresis, no fever, no headaches, no rash, no sinus congestion and no sore throat   Risk factors: no chemical exposure, no recent infection and no recent travel        History reviewed. No pertinent past medical history.  There are no problems to display for this patient.   Past Surgical History:  Procedure Laterality Date  . WISDOM TOOTH EXTRACTION         History reviewed. No pertinent family history.  Social History   Tobacco Use  . Smoking status: Former Smoker    Quit date: 07/21/2020    Years since quitting: 0.0  . Smokeless tobacco: Never Used  Vaping Use  . Vaping Use: Never used  Substance Use Topics  . Alcohol use: Yes    Comment: beer occasionally and a lot of liquor for his bday celebration  . Drug use: No    Home Medications Prior to Admission medications   Not on File    Allergies    Patient has no known allergies.  Review of Systems   Review of Systems  Constitutional: Negative for chills, diaphoresis, fatigue and fever.  HENT: Negative for sore throat.   Respiratory: Positive for cough, shortness of breath and wheezing. Negative for chest tightness  and stridor.   Cardiovascular: Negative for chest pain, palpitations and leg swelling.  Gastrointestinal: Negative for constipation, diarrhea, nausea and vomiting.  Genitourinary: Negative for flank pain.  Musculoskeletal: Positive for back pain. Negative for neck pain and neck stiffness.  Skin: Negative for rash.  Neurological: Negative for light-headedness and headaches.  Psychiatric/Behavioral: Negative for agitation.  All other systems reviewed and are negative.   Physical Exam Updated Vital Signs BP (!) 143/88 (BP Location: Right Arm)   Pulse 88   Temp 98.1 F (36.7 C) (Oral)   Resp 16   Ht 6\' 2"  (1.88 m)   Wt 90.7 kg   SpO2 99%   BMI 25.68 kg/m   Physical Exam Vitals and nursing note reviewed.  Constitutional:      General: He is not in acute distress.    Appearance: Normal appearance. He is well-developed. He is not ill-appearing, toxic-appearing or diaphoretic.  HENT:     Head: Normocephalic and atraumatic.     Nose: No congestion or rhinorrhea.     Mouth/Throat:     Mouth: Mucous membranes are moist.     Pharynx: No oropharyngeal exudate or posterior oropharyngeal erythema.  Eyes:     Extraocular Movements: Extraocular movements intact.     Conjunctiva/sclera: Conjunctivae normal.     Pupils: Pupils are equal, round, and reactive to light.  Cardiovascular:     Rate and Rhythm: Normal rate and regular rhythm.  Heart sounds: No murmur heard.   Pulmonary:     Effort: Pulmonary effort is normal. No respiratory distress.     Breath sounds: Wheezing present. No rhonchi or rales.  Chest:     Chest wall: No tenderness.  Abdominal:     General: Abdomen is flat.     Palpations: Abdomen is soft.     Tenderness: There is no abdominal tenderness. There is no right CVA tenderness, left CVA tenderness, guarding or rebound.  Musculoskeletal:        General: Tenderness present.     Cervical back: Neck supple. No tenderness.     Thoracic back: Spasms and tenderness  present.     Lumbar back: Spasms and tenderness present.       Back:     Right lower leg: No edema.     Left lower leg: No edema.     Comments: Bilateral paraspinal tenderness in the T and L-spine with muscle spasms.  No midline tenderness.  No skin difference in appearance.  No rashes.  No CVA tenderness.  Skin:    General: Skin is warm and dry.     Capillary Refill: Capillary refill takes less than 2 seconds.     Findings: No erythema.  Neurological:     General: No focal deficit present.     Mental Status: He is alert.  Psychiatric:        Mood and Affect: Mood normal.     ED Results / Procedures / Treatments   Labs (all labs ordered are listed, but only abnormal results are displayed) Labs Reviewed  RESPIRATORY PANEL BY RT PCR (FLU A&B, COVID)    EKG None  Radiology DG Chest Portable 1 View  Result Date: 07/28/2020 CLINICAL DATA:  Cough, shortness of breath EXAM: PORTABLE CHEST 1 VIEW COMPARISON:  12/01/2018 FINDINGS: The heart size and mediastinal contours are within normal limits. Both lungs are clear. The visualized skeletal structures are unremarkable. IMPRESSION: No active disease. Electronically Signed   By: Duanne GuessNicholas  Plundo D.O.   On: 07/28/2020 10:33    Procedures Procedures (including critical care time)  Medications Ordered in ED Medications  albuterol (VENTOLIN HFA) 108 (90 Base) MCG/ACT inhaler 2 puff (2 puffs Inhalation Given 07/28/20 1020)    ED Course  I have reviewed the triage vital signs and the nursing notes.  Pertinent labs & imaging results that were available during my care of the patient were reviewed by me and considered in my medical decision making (see chart for details).    MDM Rules/Calculators/A&P                          Benjamin Mullins is a 34 y.o. male with no significant past medical history who presents with chronic cough worsened this week and shortness of breath.  He reports that he used his daughter's inhaler and it helped him  somewhat.  He reports that he has since to the lungs and whenever he drinks alcohol and smokes some cigarettes, the smoke seems to make him have wheezing, cough, shortness of breath.  He is also says that when he works at his [employer] facility, the particulate matter in the air seems to also cause him to have shortness of breath and coughing.  He reports he has never had any diagnosis of reactive airway disease or asthma.  He denies any fevers, chills, or congestion.  Denies any sick contacts.  Denies any Covid exposures.  He is  not vaccinated for COVID-19 at this time.  He denies any actual chest pain or trauma.  Denies any palpitations, denies nausea vomiting, urinary symptoms or GI symptoms.  He does report he is also having some bilateral paraspinal back pain that seems he related to his coughing fits.  He denies any dysuria hematuria or history of UTIs or pyelonephritis.  On exam, patient did have a faint wheeze.  Chest and back nontender approximately.  Patient did have some paraspinal tenderness and muscle spasm palpated in his mid and low back.  This reproduces discomfort.  No actual CVA tenderness however.  No abdominal tenderness.  No lower extremity tenderness or edema seen.  Good pulses in extremities.  Clinically I suspect the patient has a reactive airway disease that is undiagnosed.  We will give him some albuterol to see if this helps with his breathing and wheezing.  We will get chest x-ray to rule out pneumonia.  Will get a Covid test given the ongoing pandemic.  Patient is feeling better, dissipate discharge with a burst of steroids for his wheezing and shortness of breath.  Anticipate discharge with plans to follow-up with a PCP.  Anticipate reassessment after work-up.  1:33 PM Covid panel negative with no evidence of Covid or flu.  Chest x-ray shows no pneumonia or other abnormality.  Patient's wheezing has resolved and breathing feels better after steroids.  Suspect undiagnosed  reactive airway disease exacerbation.  He will be given a prescription for prednisone burst for the neck several days and he will take the inhaler home.  He was given instructions on how to use it.  He will also be given prescription for muscle relaxant given the muscle spasms palpated on exam.  Patient understands return precautions and plan of care and follow-up instructions.  He denied the questions or concerns and patient was discharged in good condition with improved symptoms.   Final Clinical Impression(s) / ED Diagnoses Final diagnoses:  Wheezing  Cough  Shortness of breath  Muscle spasm of back    Rx / DC Orders ED Discharge Orders         Ordered    cyclobenzaprine (FLEXERIL) 10 MG tablet  2 times daily PRN        07/28/20 1324    predniSONE (DELTASONE) 50 MG tablet  Daily        07/28/20 1324         Clinical Impression: 1. Wheezing   2. Cough   3. Shortness of breath   4. Muscle spasm of back     Disposition: Discharge  Condition: Good  I have discussed the results, Dx and Tx plan with the pt(& family if present). He/she/they expressed understanding and agree(s) with the plan. Discharge instructions discussed at great length. Strict return precautions discussed and pt &/or family have verbalized understanding of the instructions. No further questions at time of discharge.    New Prescriptions   CYCLOBENZAPRINE (FLEXERIL) 10 MG TABLET    Take 1 tablet (10 mg total) by mouth 2 (two) times daily as needed.   PREDNISONE (DELTASONE) 50 MG TABLET    Take 1 tablet (50 mg total) by mouth daily.    Follow Up: [facility] [address] [place] [address] [phone] Schedule an appointment as soon as possible for a visit    [facility] [place] [facility]GENCY DEPARTMENT [address] [id number] mc [address] [place] [address] [phone]       Aryannah Mohon,  Canary Brim, MD 07/28/20 1334

## 2020-07-28 NOTE — ED Triage Notes (Signed)
Intermittent cough for 2 years. Bilateral lower back pain for 2 weeks. Denies any injury.

## 2020-07-28 NOTE — Discharge Instructions (Signed)
Your x-ray today did not show pneumonia or other acute abnormality but I did hear wheezing and I suspect you have a reactive airway disease precipitated by environmental factors and smoke.  Please avoid these factors if you can.  Please use the steroids for the next 5 days to help with the inflammation in your lungs.  Please use the muscle relaxant to help with the muscle spasms.  Please rest and stay hydrated.  If any symptoms change or worsen, please return to the nearest emergency department.

## 2022-07-27 IMAGING — DX DG CHEST 1V PORT
1 series · 1 of 1 positions shown · non-contrast
Comparison: 12/01/2018

CLINICAL DATA: Cough, shortness of breath

EXAM:
PORTABLE CHEST 1 VIEW

[chest ap]
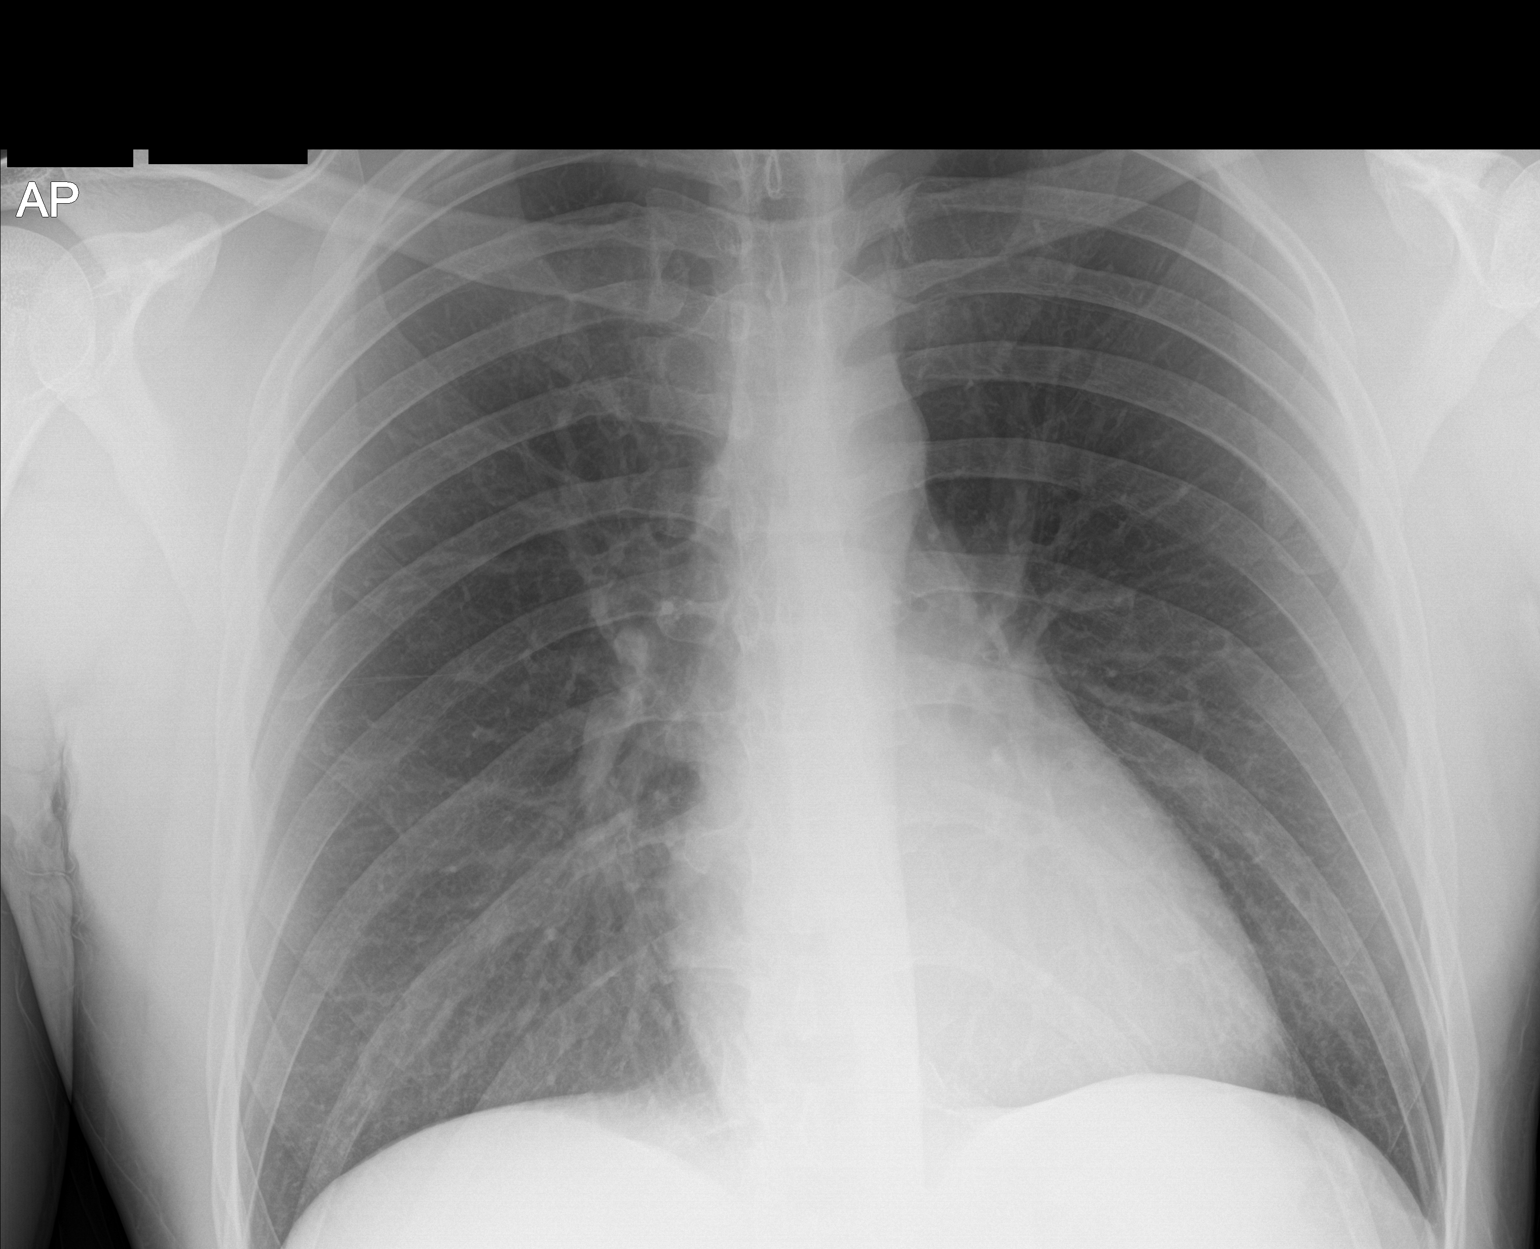

[1 of 1 positions shown; findings below may reference images not displayed]

FINDINGS: The heart size and mediastinal contours are within normal limits.
Both lungs are clear. The visualized skeletal structures are
unremarkable.
IMPRESSION: No active disease.

## 2023-09-22 ENCOUNTER — Emergency Department (HOSPITAL_COMMUNITY)
Admission: EM | Admit: 2023-09-22 | Discharge: 2023-09-22 | Disposition: A | Discharge status: Home / Self Care | Payer: BC Managed Care – PPO | Attending: Emergency Medicine | Admitting: Emergency Medicine

## 2023-09-22 ENCOUNTER — Other Ambulatory Visit: Payer: Self-pay

## 2023-09-22 DIAGNOSIS — Z6830 Body mass index (BMI) 30.0-30.9, adult: Secondary | ICD-10-CM | POA: Insufficient documentation

## 2023-09-22 DIAGNOSIS — K047 Periapical abscess without sinus: Secondary | ICD-10-CM | POA: Insufficient documentation

## 2023-09-22 DIAGNOSIS — E669 Obesity, unspecified: Secondary | ICD-10-CM | POA: Insufficient documentation

## 2023-09-22 MED ORDER — AMOXICILLIN-POT CLAVULANATE 875-125 MG PO TABS
1.0000 | ORAL_TABLET | Freq: Once | ORAL | Status: AC
  Administered 2023-09-22: 1 via ORAL
  Filled 2023-09-22: qty 1

## 2023-09-22 MED ORDER — IBUPROFEN 800 MG PO TABS
800.0000 mg | ORAL_TABLET | Freq: Once | ORAL | Status: AC
  Administered 2023-09-22: 800 mg via ORAL
  Filled 2023-09-22: qty 1

## 2023-09-22 MED ORDER — AMOXICILLIN-POT CLAVULANATE 875-125 MG PO TABS: 1.0000 | ORAL_TABLET | Freq: Two times a day (BID) | ORAL | 0 refills | Status: DC

## 2023-09-22 NOTE — ED Provider Notes (Signed)
[place] EMERGENCY DEPARTMENT AT [facility] Provider Note   CSN: [id number] Arrival date & time: 09/22/23  0021     History  Chief Complaint  Patient presents with   Dental Pain    Benjamin Mullins is a 37 y.o. male who presents with 3 days of right sided dental pain. Hx of similar pain in the past, has required multiple extractions. Believes he has an infection, and states he is concerned the dentist will not see him with an active infection.   No fevers, chills, nausea, vomiting, or difficulty swallowing. NO antibiotics in the last 3 months.   HPI     Home Medications Prior to Admission medications   Medication Sig Start Date End Date Taking? Authorizing Provider  amoxicillin-clavulanate (AUGMENTIN) 875-125 MG tablet Take 1 tablet by mouth every 12 (twelve) hours. 09/22/23  Yes Laytoya Ion R, PA-C  cyclobenzaprine (FLEXERIL) 10 MG tablet Take 1 tablet (10 mg total) by mouth 2 (two) times daily as needed. 07/28/20   Tegeler, Canary Brim, MD  predniSONE (DELTASONE) 50 MG tablet Take 1 tablet (50 mg total) by mouth daily. 07/28/20   Tegeler, Canary Brim, MD      Allergies    Patient has no known allergies.    Review of Systems   Review of Systems  HENT:  Positive for dental problem.     Physical Exam Updated Vital Signs BP (!) 179/105 (BP Location: Left Arm)   Pulse 82   Temp (!) 97.5 F (36.4 C)   Resp 18   Ht 6\' 2"  (1.88 m)   Wt 108.9 kg   SpO2 96%   BMI 30.81 kg/m  Physical Exam Vitals and nursing note reviewed.  Constitutional:      Appearance: He is obese. He is not ill-appearing or toxic-appearing.  HENT:     Head: Normocephalic and atraumatic.     Mouth/Throat:     Dentition: Dental tenderness, gingival swelling and dental caries present. No dental abscesses.     Pharynx: Oropharynx is clear. Uvula midline.     Tonsils: No tonsillar exudate.      Comments: No sublingual or submental TTP  Eyes:     General: No scleral icterus.        Right eye: No discharge.        Left eye: No discharge.     Conjunctiva/sclera: Conjunctivae normal.  Pulmonary:     Effort: Pulmonary effort is normal.  Skin:    General: Skin is warm and dry.  Neurological:     General: No focal deficit present.     Mental Status: He is alert.  Psychiatric:        Mood and Affect: Mood normal.     ED Results / Procedures / Treatments   Labs (all labs ordered are listed, but only abnormal results are displayed) Labs Reviewed - No data to display  EKG None  Radiology No results found.  Procedures Procedures    Medications Ordered in ED Medications  ibuprofen (ADVIL) tablet 800 mg (has no administration in time range)  amoxicillin-clavulanate (AUGMENTIN) 875-125 MG per tablet 1 tablet (has no administration in time range)    ED Course/ Medical Decision Making/ A&P                                 Medical Decision Making 37 y/o male with 3 days of dental pain.   HTN on intake,  cardiopulmonary exam is unremarkable. Oropharyngeal exam as above, consistent with periapical infections of decaying teeth without abscess. NO evidence of Ludwig's angina.    Clinical concern for emergent underlying etiology that would warrant further ED workup or inpatient management is exceedingly low. First dose abx administered in the ED.   Rockey voiced understanding of his medical evaluation and treatment plan. Each of their questions answered to their expressed satisfaction.  Return precautions were given.  Patient is well-appearing, stable, and was discharged in good condition.  This chart was dictated using voice recognition software, Dragon. Despite the best efforts of this provider to proofread and correct errors, errors may still occur which can change documentation meaning.         Final Clinical Impression(s) / ED Diagnoses Final diagnoses:  Dental infection    Rx / DC Orders ED Discharge Orders          Ordered     amoxicillin-clavulanate (AUGMENTIN) 875-125 MG tablet  Every 12 hours        09/22/23 0045              Kairee Kozma, Eugene Gavia, PA-C 09/22/23 0051    Shon Baton, MD 09/23/23 [phone]

## 2023-09-22 NOTE — Discharge Instructions (Addendum)
You have a dental infection. Please take entire course of antibiotics as directed.  Continue using ibuprofen / Tylenol, as well as prescribed Orajel for pain.  You will need to follow-up with your dentist for continued management of this. Please see dental resources below. Return to the emergency department for fevers, swelling or pain under the tongue or in the neck, difficulty breathing or swallowing, nausea or vomiting that does not stop, or any other new or concerning symptoms.  Additionally please follow up with your primary care doctor regarding your elevated blood pressures.

## 2023-09-22 NOTE — ED Triage Notes (Signed)
Pt reports right, upper dental pain for the last three days. Denies any swelling.

## 2023-09-29 ENCOUNTER — Other Ambulatory Visit (HOSPITAL_BASED_OUTPATIENT_CLINIC_OR_DEPARTMENT_OTHER): Payer: Self-pay

## 2023-09-29 ENCOUNTER — Encounter (HOSPITAL_COMMUNITY): Payer: Self-pay

## 2023-09-29 ENCOUNTER — Other Ambulatory Visit: Payer: Self-pay

## 2023-09-29 ENCOUNTER — Emergency Department (HOSPITAL_COMMUNITY)
Admission: EM | Admit: 2023-09-29 | Discharge: 2023-09-29 | Disposition: A | Discharge status: Home / Self Care | Payer: Self-pay | Attending: Emergency Medicine | Admitting: Emergency Medicine

## 2023-09-29 DIAGNOSIS — K0889 Other specified disorders of teeth and supporting structures: Secondary | ICD-10-CM | POA: Insufficient documentation

## 2023-09-29 MED ORDER — KETOROLAC TROMETHAMINE 30 MG/ML IJ SOLN
30.0000 mg | Freq: Once | INTRAMUSCULAR | Status: AC
  Administered 2023-09-29: 30 mg via INTRAMUSCULAR
  Filled 2023-09-29: qty 1

## 2023-09-29 MED ORDER — AMOXICILLIN 500 MG PO CAPS: 500.0000 mg | ORAL_CAPSULE | Freq: Three times a day (TID) | ORAL | 0 refills | Status: AC

## 2023-09-29 MED ORDER — AMOXICILLIN 500 MG PO CAPS
500.0000 mg | ORAL_CAPSULE | Freq: Three times a day (TID) | ORAL | 0 refills | Status: DC
  Filled 2023-09-29: qty 30, 10d supply, fill #0

## 2023-09-29 MED ORDER — AMOXICILLIN 500 MG PO CAPS
500.0000 mg | ORAL_CAPSULE | Freq: Once | ORAL | Status: AC
  Administered 2023-09-29: 500 mg via ORAL
  Filled 2023-09-29: qty 1

## 2023-09-29 NOTE — ED Triage Notes (Signed)
Pt reports with right upper and lower dental pain that started weeks ago. Pt finished his last dose of abts today.

## 2023-09-29 NOTE — ED Notes (Signed)
Attempted to change Pt into gown for assessment. Pt stated that "I don't understand why I have to change into a gown. The last times I've been here, they've just given me a shot of dilaudid and let me leave." Staff attempted to explain to Pt that he was needed to change into gown for assessment. Pt stated he wasn't going to change. Staff told Pt that he was scheduled for surgery tomorrow, and that there was a high likelihood that he would be admitted.

## 2023-09-29 NOTE — ED Provider Notes (Signed)
[facility]ENC[facility] DEPT [facility] Emergency Department Provider Note MRN:  [id number]  Arrival date & time: 09/29/23     Chief Complaint   Dental Pain   History of Present Illness   Benjamin Mullins is a 37 y.o. year-old male presents to the ED with chief complaint of dental pain.  He reports that he has infections of his right upper and right lower teeth.  States that he has not seen a dentist.  He denies any improvement after last round of antibiotics.  History provided by patient.   Review of Systems  Pertinent positive and negative review of systems noted in HPI.    Physical Exam   Vitals:   09/29/23 0044  BP: (!) 147/125  Pulse: 97  Resp: 18  Temp: 97.8 F (36.6 C)  SpO2: 96%    CONSTITUTIONAL:  well-appearing, NAD NEURO:  Alert and oriented x 3, CN 3-12 grossly intact EYES:  eyes equal and reactive ENT/NECK:  Supple, no stridor, poor dentition, no drainable abscess CARDIO:  normal rate, regular rhythm, appears well-perfused  PULM:  No respiratory distress, CTAB GI/GU:  non-distended,  MSK/SPINE:  No gross deformities, no edema, moves all extremities  SKIN:  no rash, atraumatic   *Additional and/or pertinent findings included in MDM below  Diagnostic and Interventional Summary    EKG Interpretation Date/Time:    Ventricular Rate:    PR Interval:    QRS Duration:    QT Interval:    QTC Calculation:   R Axis:      Text Interpretation:         Labs Reviewed - No data to display  No orders to display    Medications  ketorolac (TORADOL) 30 MG/ML injection 30 mg (has no administration in time range)  amoxicillin (AMOXIL) capsule 500 mg (has no administration in time range)     Procedures  /  Critical Care Procedures  ED Course and Medical Decision Making  I have reviewed the triage vital signs, the nursing notes, and pertinent available records from the EMR.  Social Determinants Affecting Complexity of Care: Patient has no clinically  significant social determinants affecting this chief complaint..   ED Course:    Medical Decision Making Patient with dentalgia.  No abscess requiring immediate incision and drainage.  Exam not concerning for Ludwig's angina or pharyngeal abscess.  Will treat with amox. Pt instructed to follow-up with dentist.  Discussed return precautions. Pt safe for discharge.  I've urged dental follow-up.   Risk Prescription drug management.         Consultants: No consultations were needed in caring for this patient.   Treatment and Plan: Emergency department workup does not suggest an emergent condition requiring admission or immediate intervention beyond  what has been performed at this time. The patient is safe for discharge and has  been instructed to return immediately for worsening symptoms, change in  symptoms or any other concerns    Final Clinical Impressions(s) / ED Diagnoses     ICD-10-CM   1. Pain, dental  K08.89       ED Discharge Orders          Ordered    amoxicillin (AMOXIL) 500 MG capsule  3 times daily        09/29/23 0159              Discharge Instructions Discussed with and Provided to Patient:    Discharge Instructions      You need to follow-up with a  dentist.  This will not get better without a dentist.      Roxy Horseman, PA-C 09/29/23 0203    Palumbo, April, MD 09/29/23 0207

## 2023-09-29 NOTE — Discharge Instructions (Addendum)
You need to follow-up with a dentist.  This will not get better without a dentist.
